# Patient Record
Sex: Female | Born: 1980 | Race: Black or African American | Hispanic: No | Marital: Single | Smoking: Never smoker
Health system: Southern US, Community
[De-identification: ages and names within clinical notes are randomized; demographics above are authoritative.]

## PROBLEM LIST (undated history)

## (undated) DIAGNOSIS — G43909 Migraine, unspecified, not intractable, without status migrainosus: Secondary | ICD-10-CM

## (undated) HISTORY — PX: CHOLECYSTECTOMY: SHX55

## (undated) HISTORY — DX: Migraine, unspecified, not intractable, without status migrainosus: G43.909

---

## 2004-10-14 ENCOUNTER — Emergency Department: Payer: Self-pay | Admitting: Emergency Medicine

## 2005-10-01 ENCOUNTER — Emergency Department: Payer: Self-pay | Admitting: General Practice

## 2005-12-19 ENCOUNTER — Emergency Department: Payer: Self-pay | Admitting: Emergency Medicine

## 2009-07-10 ENCOUNTER — Ambulatory Visit: Payer: Self-pay | Admitting: Family Medicine

## 2010-01-15 ENCOUNTER — Emergency Department: Payer: Self-pay | Admitting: Emergency Medicine

## 2010-08-21 ENCOUNTER — Ambulatory Visit: Payer: Self-pay | Admitting: Internal Medicine

## 2011-11-18 ENCOUNTER — Observation Stay: Payer: Self-pay

## 2011-11-27 ENCOUNTER — Ambulatory Visit: Payer: Self-pay

## 2011-11-27 LAB — CBC WITH DIFFERENTIAL/PLATELET
Basophil #: 0 10*3/uL (ref 0.0–0.1)
Basophil %: 0.3 %
Eosinophil %: 0.5 %
HCT: 31.7 % — ABNORMAL LOW (ref 35.0–47.0)
Lymphocyte #: 1.9 10*3/uL (ref 1.0–3.6)
MCH: 27.4 pg (ref 26.0–34.0)
MCV: 83 fL (ref 80–100)
Monocyte #: 0.9 x10 3/mm (ref 0.2–0.9)
Neutrophil #: 7.1 10*3/uL — ABNORMAL HIGH (ref 1.4–6.5)
Neutrophil %: 71.7 %
RBC: 3.83 10*6/uL (ref 3.80–5.20)
RDW: 15.9 % — ABNORMAL HIGH (ref 11.5–14.5)
WBC: 10 10*3/uL (ref 3.6–11.0)

## 2011-11-28 ENCOUNTER — Inpatient Hospital Stay: Payer: Self-pay

## 2011-11-29 LAB — HEMATOCRIT: HCT: 25.2 % — ABNORMAL LOW (ref 35.0–47.0)

## 2014-02-09 ENCOUNTER — Emergency Department: Payer: Self-pay | Admitting: Emergency Medicine

## 2014-02-09 LAB — COMPREHENSIVE METABOLIC PANEL
ALBUMIN: 4.2 g/dL (ref 3.4–5.0)
ALT: 28 U/L
ANION GAP: 10 (ref 7–16)
AST: 20 U/L (ref 15–37)
Alkaline Phosphatase: 52 U/L
BUN: 14 mg/dL (ref 7–18)
Bilirubin,Total: 0.8 mg/dL (ref 0.2–1.0)
CALCIUM: 9 mg/dL (ref 8.5–10.1)
CO2: 22 mmol/L (ref 21–32)
CREATININE: 0.83 mg/dL (ref 0.60–1.30)
Chloride: 107 mmol/L (ref 98–107)
EGFR (African American): >60
EGFR (Non-African Amer.): >60
Glucose: 115 mg/dL — ABNORMAL HIGH (ref 65–99)
OSMOLALITY: 279 (ref 275–301)
Potassium: 3.5 mmol/L (ref 3.5–5.1)
SODIUM: 139 mmol/L (ref 136–145)
TOTAL PROTEIN: 8.4 g/dL — AB (ref 6.4–8.2)

## 2014-02-09 LAB — URINALYSIS, COMPLETE
Bilirubin,UR: NEGATIVE
GLUCOSE, UR: NEGATIVE mg/dL (ref 0–75)
LEUKOCYTE ESTERASE: NEGATIVE
NITRITE: NEGATIVE
PH: 5 (ref 4.5–8.0)
RBC,UR: 35 /HPF (ref 0–5)
Specific Gravity: 1.033 (ref 1.003–1.030)
Squamous Epithelial: 8
WBC UR: 2 /HPF (ref 0–5)

## 2014-02-09 LAB — CBC WITH DIFFERENTIAL/PLATELET
BASOS ABS: 0.1 10*3/uL (ref 0.0–0.1)
Basophil %: 0.4 %
Eosinophil #: 0 10*3/uL (ref 0.0–0.7)
Eosinophil %: 0.1 %
HCT: 42.6 % (ref 35.0–47.0)
HGB: 13.8 g/dL (ref 12.0–16.0)
LYMPHS ABS: 2.4 10*3/uL (ref 1.0–3.6)
LYMPHS PCT: 15.8 %
MCH: 29.3 pg (ref 26.0–34.0)
MCHC: 32.3 g/dL (ref 32.0–36.0)
MCV: 91 fL (ref 80–100)
MONOS PCT: 7 %
Monocyte #: 1 x10 3/mm — ABNORMAL HIGH (ref 0.2–0.9)
NEUTROS PCT: 76.7 %
Neutrophil #: 11.5 10*3/uL — ABNORMAL HIGH (ref 1.4–6.5)
PLATELETS: 307 10*3/uL (ref 150–440)
RBC: 4.7 10*6/uL (ref 3.80–5.20)
RDW: 13.6 % (ref 11.5–14.5)
WBC: 15 10*3/uL — ABNORMAL HIGH (ref 3.6–11.0)

## 2014-02-09 LAB — LIPASE, BLOOD: Lipase: 76 U/L (ref 73–393)

## 2014-02-09 LAB — PREGNANCY, URINE: PREGNANCY TEST, URINE: NEGATIVE m[IU]/mL

## 2014-03-18 HISTORY — PX: HYSTEROSCOPY: SHX211

## 2014-07-05 NOTE — Op Note (Signed)
PATIENT NAME:  Angela Holmes, Angela Holmes MR#:  [id number] DATE OF BIRTH:  1980/10/26  DATE OF PROCEDURE:  11/28/2011  PREOPERATIVE DIAGNOSIS: Intrauterine pregnancy, term. Previous cesarean section, desires repeat.  POSTOPERATIVE DIAGNOSIS: Intrauterine pregnancy, term. Previous cesarean section, desires repeat.   PROCEDURE PERFORMED: Low transverse cesarean section.   SURGEON: Deloris Pinghilip J. Luella Cookosenow, M.D.   FIRST ASSISTANT: Thomasene MohairStephen Jackson, M.D.  NEONATOLOGIST: Dr. Beckie Saltsasnadi.   OPERATIVE FINDINGS: 8 pounds, 10 ounces female infant, [place], delivered at 8:03 a.m., Apgars 8 and 9. There were some adhesions on the left side.   DESCRIPTION OF PROCEDURE: After adequate conduction anesthesia, the patient was prepped and draped in routine fashion. A skin incision in modified Pfannenstiel fashion was made and carried down the various layers and the peritoneal cavity was entered. Bladder flap was created and the bladder was pushed down. A low transverse incision was made. The above-described infant was delivered without difficulty. The placenta was removed manually. The uterus was then closed with continuous lock suture of chromic one. Several additional sutures were required for hemostasis. Ultimately, hemostasis was obtained. There were some adhesions from the left fundus to the omentum. These were clamped, divided and suture ligated. The pelvis was lavaged with copious amounts of saline. Rectus muscles were reapproximated in the midline. On-Q pump was placed in the fascia, reapproximated with continuous suture of Maxon. Care was taken not to incorporate the On=Q catheters ____________ Deloris PingPhilip J. Luella Cookosenow, MD pjr:ap D: 11/28/2011 08:37:10 ET                T: 11/28/2011 10:40:48 ET              JOB#: [id number] cc: Deloris PingPhilip J. Luella Cookosenow, MD, <Dictator> Towana BadgerPHILIP J Vannary Greening MD ELECTRONICALLY SIGNED 12/02/2011 22:08

## 2014-07-05 NOTE — Op Note (Signed)
PATIENT NAME:  Angela Holmes, Angela Holmes MR#:  [id number] DATE OF BIRTH:  09-10-1980  DATE OF PROCEDURE:  11/28/2011  CONTINUATION OF DICTATION   Care was taken to make sure the On-Q pump was imterior to the fascia. Skin was closed with skin staples. Estimated blood loss was 500 mL. Patient tolerated procedure well, left the Operating Room in good condition. Sponge and needle counts were said to be correct at the end of the procedure.   ____________________________ Deloris PingPhilip J. Luella Cookosenow, MD pjr:cms D: 11/28/2011 08:38:00 ET T: 11/28/2011 10:37:26 ET JOB#: [id number]  cc: Deloris PingPhilip J. Luella Cookosenow, MD, <Dictator>  Towana BadgerPHILIP J Jayren Cease MD ELECTRONICALLY SIGNED 11/29/2011 15:52

## 2015-08-23 LAB — HM PAP SMEAR: HM Pap smear: NEGATIVE

## 2016-06-04 IMAGING — CT CT ABD-PELV W/ CM
2 of 4 series · 16 of 46 positions shown, 18 images · IV contrast (isovue)
Comparison: Right upper quadrant ultrasound earlier this same day.

CLINICAL DATA: Abdominal pain for 3 days. Nausea, vomiting, fever
and chills.

EXAM:
CT ABDOMEN AND PELVIS WITH CONTRAST
TECHNIQUE: Multidetector CT imaging of the abdomen and pelvis was performed
using the standard protocol following bolus administration of
intravenous contrast.
CONTRAST:  100 cc Isovue 370.

[Series 2: routine abd pel with · axial · 0.71mm/px · z∈[-498,-114]mm · 13 of 85 slices shown, 15 images]
[im 4/85  soft-tissue]
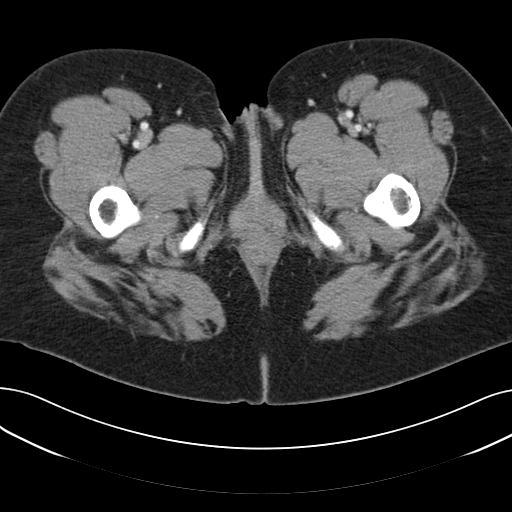
[im 4/85  bone]
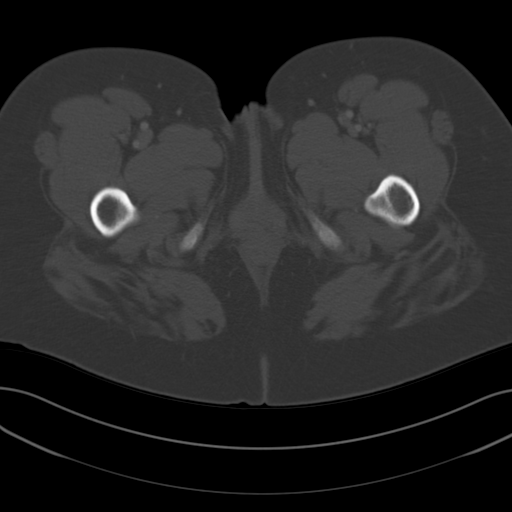
[im 11/85  soft-tissue]
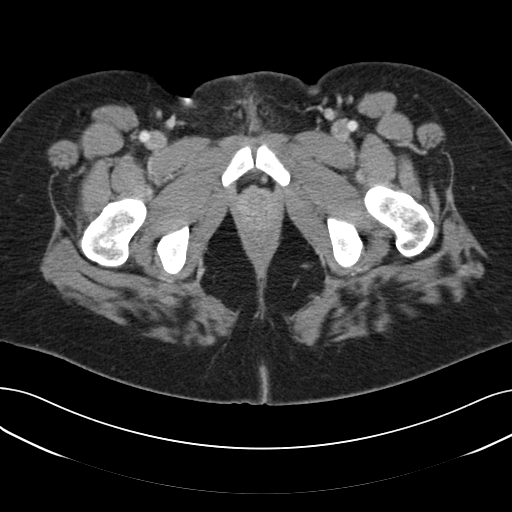
[im 18/85  soft-tissue]
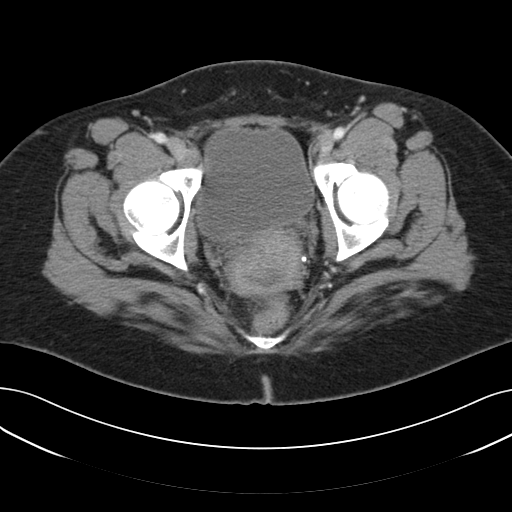
[im 25/85  soft-tissue]
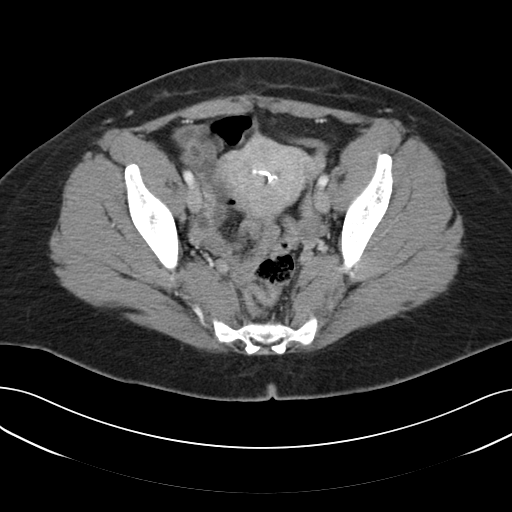
[im 29/85  soft-tissue]
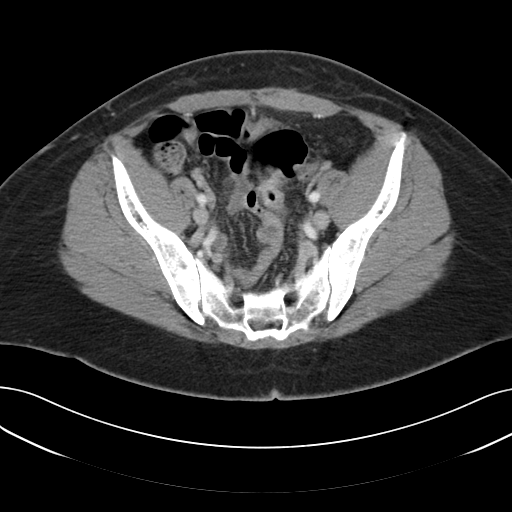
[im 36/85  soft-tissue]
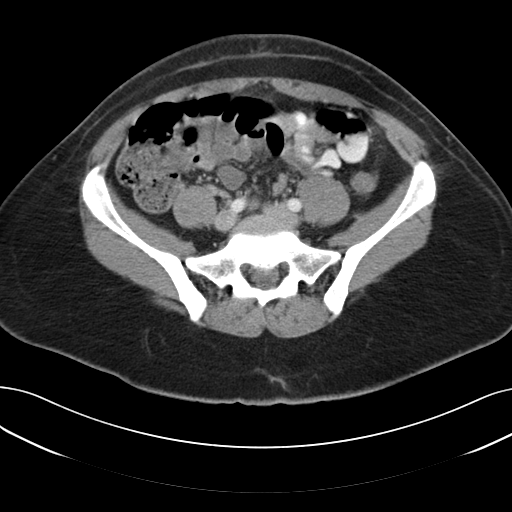
[im 43/85  soft-tissue]
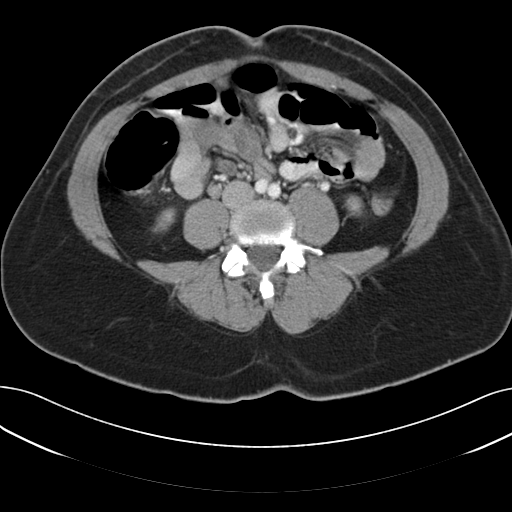
[im 50/85  soft-tissue]
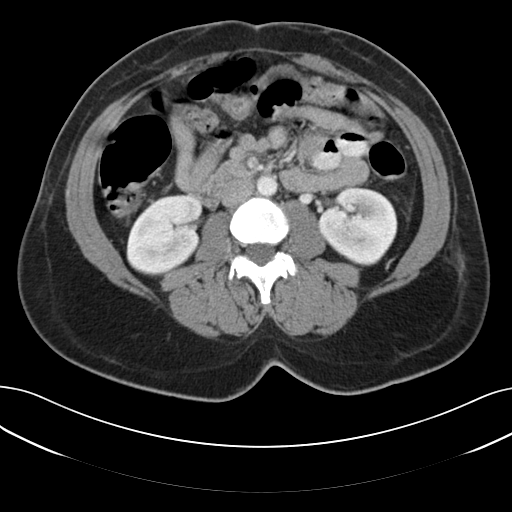
[im 57/85  soft-tissue]
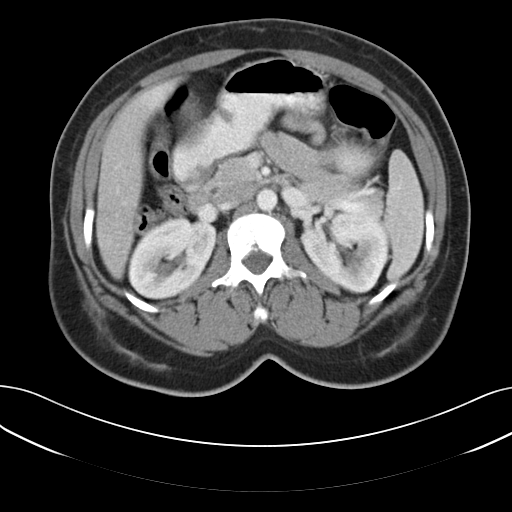
[im 57/85  bone]
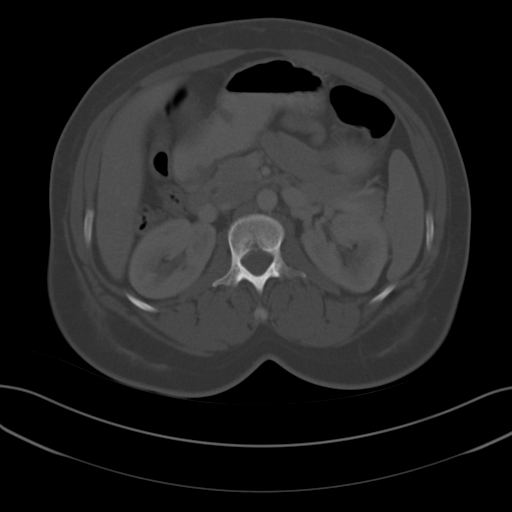
[im 60/85  soft-tissue]
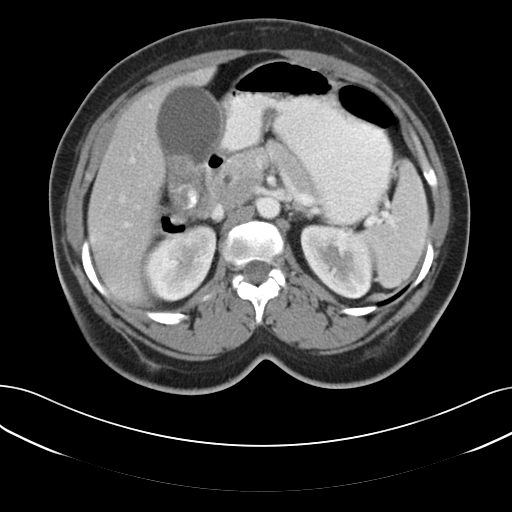
[im 67/85  soft-tissue]
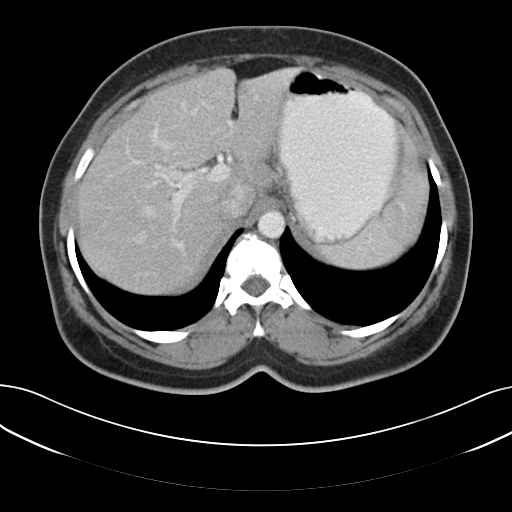
[im 74/85  soft-tissue]
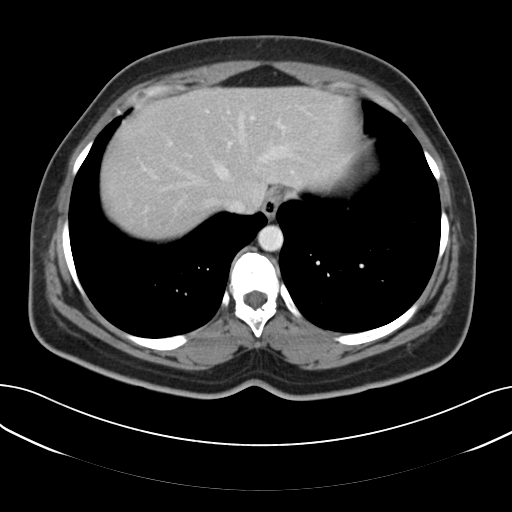
[im 81/85  soft-tissue]
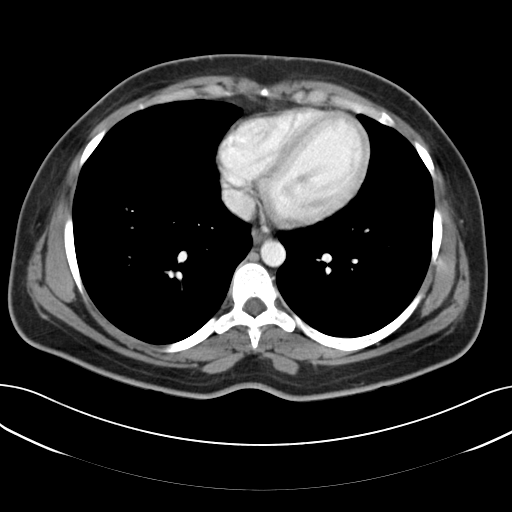

[Series 5: cor routine abd pel with · coronal · 0.77mm/px · 3 of 131 slices shown]
[im 44/131  soft-tissue]
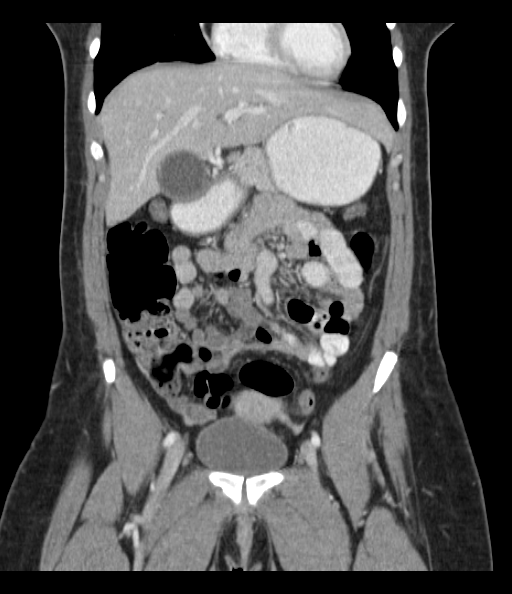
[im 58/131  soft-tissue]
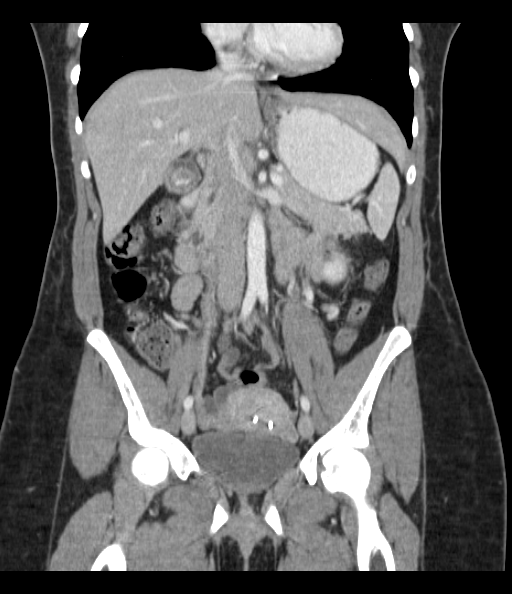
[im 73/131  soft-tissue]
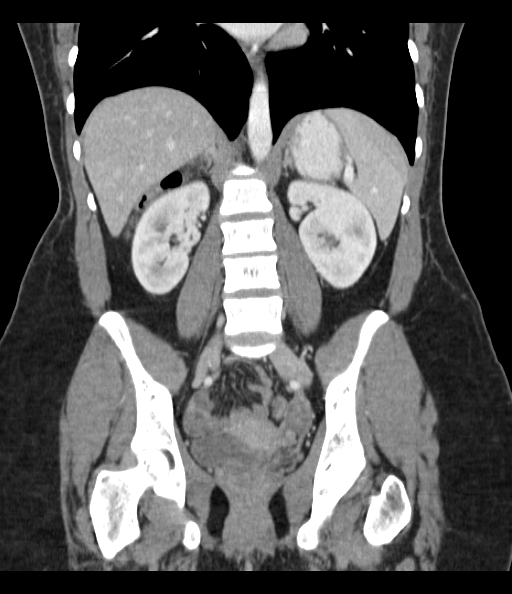

[16 of 46 positions shown; findings below may reference images not displayed]

FINDINGS: The lung bases are clear. There is no pleural or pericardial
effusion.

Three gallstones are identified measuring up to 2.4 cm in diameter.
The gallbladder is mildly dilated but there is no gallbladder wall
thickening or pericholecystic fluid. The liver, biliary tree,
adrenal glands, spleen and pancreas appear normal. The patient has a
duplicated left renal collecting system and there may be duplication
of the right renal collecting system. The kidneys are otherwise
unremarkable.

IUD is in place in the uterus. The uterus is otherwise unremarkable.
Adnexa and urinary bladder appear normal. Trace amount of free
pelvic fluid is compatible with physiologic change. The stomach,
small and large bowel and appendix appear normal. There is no
lymphadenopathy. No lytic or sclerotic bony lesion is seen.
Scattered degenerative disc disease is noted.
IMPRESSION: Negative for appendicitis.  No acute finding abdomen or pelvis.

Gallstones.

Duplicated duplicated left and possibly duplicated right renal
collecting systems.

## 2018-11-17 ENCOUNTER — Encounter: Payer: Self-pay | Admitting: Advanced Practice Midwife

## 2018-11-17 ENCOUNTER — Other Ambulatory Visit: Payer: Self-pay

## 2018-11-17 ENCOUNTER — Ambulatory Visit: Payer: Self-pay

## 2018-11-17 ENCOUNTER — Ambulatory Visit (LOCAL_COMMUNITY_HEALTH_CENTER): Payer: Self-pay | Admitting: Advanced Practice Midwife

## 2018-11-17 VITALS — BP 124/82 | Ht 62.5 in | Wt 189.0 lb

## 2018-11-17 DIAGNOSIS — Z30011 Encounter for initial prescription of contraceptive pills: Secondary | ICD-10-CM

## 2018-11-17 DIAGNOSIS — Z3009 Encounter for other general counseling and advice on contraception: Secondary | ICD-10-CM

## 2018-11-17 MED ORDER — NORGESTIM-ETH ESTRAD TRIPHASIC 0.18/0.215/0.25 MG-25 MCG PO TABS: 1.0000 | ORAL_TABLET | Freq: Every day | ORAL | 0 refills | Status: DC

## 2018-11-17 NOTE — Addendum Note (Signed)
Addended by: Debera Lat on: 11/17/2018 09:44 AM   Modules accepted: Orders

## 2018-11-17 NOTE — Progress Notes (Signed)
In for new [facility] since Depo due; wants to change to [facility] Debera Lat, RN

## 2018-11-17 NOTE — Progress Notes (Signed)
   [place] problem visit  [place] Department  Subjective:  TURA ROLLER is a 38 y.o. G3P2 nonsmoker being seen today for initiation of ocp's  Chief Complaint  Patient presents with  . Contraception    HPI   Does the patient have a current or past history of drug use? No   No components found for: HCV]   Health Maintenance Due  Topic Date Due  . HIV Screening  07/27/1993  . TETANUS/TDAP  07/27/1997  . PAP SMEAR-Modifier  07/27/2008  . INFLUENZA VACCINE  10/17/2018    ROS  The following portions of the patient's history were reviewed and updated as appropriate: allergies, current medications, past family history, past medical history, past social history, past surgical history and problem list. Problem list updated.   See flowsheet for other program required questions.  Objective:   Vitals:   11/17/18 0847  BP: 124/82  Weight: 189 lb (85.7 kg)  Height: 5' 2.5" (1.588 m)    Physical Exam  Not done    Assessment and Plan:  HENLI HEY is a 38 y.o. female presenting to the [facility] Department for a Women's Health problem visit  1. Family planning Pt states has been on DMPA and is due for next DMPA next week but began cramping.  Now wants ocp's instead.  Last pap at [facility] 08/2015 and next due 2022.  Needs PE 02/2019.  LMP 08/2018.  Last sex 2 years ago per pt  2. Encounter for initial prescription of contraceptive pills Medical hx neg for cancer, MI, stoke, embolism, no contraindications for ocp's.  Denies smoking cigs, cigars, MJ May have OTC-Lo #6 I po daily to begin today.  Please counsel on need for backup condoms first week.  Warned about possible spotting/irregular bleeding with initiation of ocp's      Return in about 4 months (around 03/19/2019) for yearly physical exam + ocp's.  No future appointments.  Herbie Saxon, CNM

## 2019-07-21 ENCOUNTER — Encounter: Payer: Self-pay | Admitting: Family Medicine

## 2019-07-21 ENCOUNTER — Ambulatory Visit (LOCAL_COMMUNITY_HEALTH_CENTER): Payer: BLUE CROSS/BLUE SHIELD | Admitting: Family Medicine

## 2019-07-21 ENCOUNTER — Other Ambulatory Visit: Payer: Self-pay

## 2019-07-21 VITALS — BP 119/72 | Ht 62.0 in | Wt 179.2 lb

## 2019-07-21 DIAGNOSIS — Z30013 Encounter for initial prescription of injectable contraceptive: Secondary | ICD-10-CM

## 2019-07-21 DIAGNOSIS — Z3009 Encounter for other general counseling and advice on contraception: Secondary | ICD-10-CM | POA: Diagnosis not present

## 2019-07-21 MED ORDER — MEDROXYPROGESTERONE ACETATE 150 MG/ML IM SUSP
150.0000 mg | INTRAMUSCULAR | Status: AC
  Administered 2019-07-21: 150 mg via INTRAMUSCULAR

## 2019-07-21 NOTE — Progress Notes (Signed)
Patient here to switch from OC to Depo. Last PE 02/2018. Last Pap 2017 at [facility], next due 2022. States she took about 3 weeks of OC and had bad headaches and other symptoms.Burt Knack, RN

## 2019-07-21 NOTE — Progress Notes (Signed)
Family Planning Visit  Subjective:  Angela Holmes is a 39 y.o. being seen today for  Chief Complaint  Patient presents with  . Contraception    wants to switch back to Depo from OC    Pt does not have a problem list on file.  HPI  Patient reports she is here for restart Depo. She switched from depo to OCPs last year, didn't like, would like to switch back. Prior to switch she had been on depo x2 yrs.  Pt denies all of the following, which are contraindications to Depo use: Known breast cancer Pregnancy Also denies: Hypertension (CDC cat 2 if mild, cat 3 if severe) Severe cirrhosis, hepatocellular adenoma Diabetes with nephrosis or vascular complications Ischemic heart disease or multiple risk factors for atherosclerotic disease, and some forms of lupus Unexplained vaginal bleeding Pregnancy planned within the next year Long-term use of corticosteroid therapy in women with a history of, or risk factors for, nontraumatic (frailty) fractures.  Current use of aminoglutethimide (usually for the treatment of Cushing's syndrome) because aminoglutethimide may increase metabolism of progestins   Patient's last menstrual period was 06/25/2019 (exact date). Last sex: 3 yrs ago BCM: none Pt desires EC? n/a  Last pap: 08/2015, normal per pt. Next due 2022.  Last breast exam: last year  Patient reports 0 partner(s) in last year. Do they desire STI screening (if no, why not)? no  Does the patient desire a pregnancy in the next year? no   38 y.o., Body mass index is 32.78 kg/m. - Is patient eligible for HA1C diabetes screening based on BMI and age >69?  no  Has patient been screened once for HCV in the past?  no  No results found for: HCVAB  Does the patient have current of drug use, have a partner with drug use, and/or has been incarcerated since last result? no If yes-- Screen for HCV through [facility]   Does the patient meet criteria for HBV testing? no  Criteria:   -Household, sexual or needle sharing contact with HBV -History of drug use -HIV positive -Those with known Hep C  See flowsheet for other program required questions.   Health Maintenance Due  Topic Date Due  . HIV Screening  Never done  . COVID-19 Vaccine (1) Never done  . TETANUS/TDAP  Never done    ROS  See HPI  The following portions of the patient's history were reviewed and updated as appropriate: allergies, current medications, past family history, past medical history, past social history, past surgical history and problem list. Problem list updated.  Objective:   Vitals:   07/21/19 1324  BP: 119/72  Weight: 179 lb 3.2 oz (81.3 kg)  Height: 5\' 2"  (1.575 m)     Physical Exam  Gen: well appearing, NAD HEENT: no scleral icterus Lung: Normal WOB Ext: well perfused, no edema    Assessment and Plan:  Angela Holmes is a 39 y.o. female presenting to the [facility] Department for a well woman exam/family planning visit  Contraception counseling: Reviewed all forms of birth control options in the tiered based approach. available including abstinence; over the counter/barrier methods; hormonal contraceptive medication including pill, patch, ring, injection,contraceptive implant, ECP; hormonal and nonhormonal IUDs; permanent sterilization options including vasectomy and the various tubal sterilization modalities. Risks, benefits, and typical effectiveness rates were reviewed.  Questions were answered.  Written information was also given to the patient to review.  Patient desires depo, this was prescribed for  patient. She will follow up in  3 month for surveillance.  She was told to call with any further questions, or with any concerns about this method of contraception.  Emphasized use of condoms 100% of the time for STI prevention.  Emergency Contraception: n/a  1. Family planning services -Rx depo x1 year. Counseling as above, including possible risks of  Depo use >2 yrs to BMD. Alternative BCM discussed, pt would like to continue Depo. Suggested calcium (1200 mg daily) and vitamin D (800 IU daily) supplements, weight bearing exercise and avoiding cigarette smoking and excessive alcohol consumption to promote bone health.  - medroxyPROGESTERone (DEPO-PROVERA) injection 150 mg    Return in about 11 weeks (around 10/06/2019) for Depo, Physical exam.  No future appointments.  Kandee Keen, PA-C

## 2019-07-21 NOTE — Progress Notes (Signed)
Depo consent signed and depo given. Next depo card given.Burt Knack, RN

## 2020-03-31 ENCOUNTER — Ambulatory Visit: Payer: BLUE CROSS/BLUE SHIELD

## 2020-04-04 ENCOUNTER — Other Ambulatory Visit: Payer: Self-pay

## 2020-04-04 ENCOUNTER — Ambulatory Visit (LOCAL_COMMUNITY_HEALTH_CENTER): Payer: BLUE CROSS/BLUE SHIELD | Admitting: Physician Assistant

## 2020-04-04 ENCOUNTER — Encounter: Payer: Self-pay | Admitting: Physician Assistant

## 2020-04-04 VITALS — BP 117/74 | Ht 62.0 in | Wt 162.8 lb

## 2020-04-04 DIAGNOSIS — Z0000 Encounter for general adult medical examination without abnormal findings: Secondary | ICD-10-CM | POA: Diagnosis not present

## 2020-04-04 DIAGNOSIS — Z3009 Encounter for other general counseling and advice on contraception: Secondary | ICD-10-CM

## 2020-04-04 DIAGNOSIS — Z3042 Encounter for surveillance of injectable contraceptive: Secondary | ICD-10-CM | POA: Diagnosis not present

## 2020-04-04 MED ORDER — MEDROXYPROGESTERONE ACETATE 150 MG/ML IM SUSP
150.0000 mg | INTRAMUSCULAR | Status: AC
  Administered 2020-04-04: 150 mg via INTRAMUSCULAR

## 2020-04-04 NOTE — Progress Notes (Signed)
Pt to clinic for physical and restart depo. Pt states only sex in the last 3 years was two times in the last 3 days, always with condom, no problems with condoms breaking.

## 2020-04-04 NOTE — Progress Notes (Signed)
[employer]

## 2020-04-04 NOTE — Progress Notes (Signed)
Pt states she will use a back up method like condoms for 2 weeks or just abstain with depo restart today. Pt accepted latex free condoms. Provider orders completed.

## 2020-04-04 NOTE — Progress Notes (Signed)
Family Planning Visit- Repeat Yearly Visit  Subjective:  Angela Holmes is a 40 y.o. [phone]  being seen today for an well woman visit and to discuss family planning options.    She is currently using Condoms for pregnancy prevention. Patient reports she does not want a pregnancy in the next year. Patient  does not have a problem list on file.  Chief Complaint  Patient presents with  . Contraception    PE and restart depo (in hip)    Patient reports that she is doing well and would like to restart Depo.  Denies problems with Depo previously and states that she thinks the last one was in May of 2021.  Per chart review, CBE is due annually and pap is due 08/2020.  Patient denies any concerns today.    See flowsheet for other program required questions.   Body mass index is 29.78 kg/m. - Patient is eligible for diabetes screening based on BMI and age >47?  not applicable HA1C ordered? not applicable  Patient reports 1 partner in last year. Desires STI screening?  No - patient declines.   Has patient been screened once for HCV in the past?  No  No results found for: HCVAB  Does the patient have current of drug use, have a partner with drug use, and/or has been incarcerated since last result? No  If yes-- Screen for HCV through [facility]   Does the patient meet criteria for HBV testing? No  Criteria:  -Household, sexual or needle sharing contact with HBV -History of drug use -HIV positive -Those with known Hep C   Health Maintenance Due  Topic Date Due  . Hepatitis C Screening  Never done  . HIV Screening  Never done  . TETANUS/TDAP  Never done  . INFLUENZA VACCINE  Never done    Review of Systems  All other systems reviewed and are negative.   The following portions of the patient's history were reviewed and updated as appropriate: allergies, current medications, past family history, past medical history, past social history, past surgical history and problem  list. Problem list updated.  Objective:   Vitals:   04/04/20 1435  BP: 117/74  Weight: 162 lb 12.8 oz (73.8 kg)  Height: 5\' 2"  (1.575 m)    Physical Exam Vitals and nursing note reviewed.  Constitutional:      General: She is not in acute distress.    Appearance: Normal appearance.  HENT:     Head: Normocephalic and atraumatic.     Mouth/Throat:     Mouth: Mucous membranes are moist.     Pharynx: Oropharynx is clear. No oropharyngeal exudate or posterior oropharyngeal erythema.  Eyes:     Conjunctiva/sclera: Conjunctivae normal.  Neck:     Thyroid: No thyroid mass, thyromegaly or thyroid tenderness.  Cardiovascular:     Rate and Rhythm: Normal rate and regular rhythm.  Pulmonary:     Effort: Pulmonary effort is normal.     Breath sounds: Normal breath sounds.  Chest:  Breasts:     Right: Normal. No mass, nipple discharge, skin change, tenderness, axillary adenopathy or supraclavicular adenopathy.     Left: Normal. No mass, nipple discharge, skin change, tenderness, axillary adenopathy or supraclavicular adenopathy.    Abdominal:     Palpations: Abdomen is soft. There is no mass.     Tenderness: There is no abdominal tenderness. There is no guarding or rebound.  Musculoskeletal:     Cervical back: Neck supple.  No tenderness.  Lymphadenopathy:     Cervical: No cervical adenopathy.     Upper Body:     Right upper body: No supraclavicular, axillary or pectoral adenopathy.     Left upper body: No supraclavicular, axillary or pectoral adenopathy.  Skin:    General: Skin is warm and dry.     Findings: No bruising, erythema, lesion or rash.  Neurological:     Mental Status: She is alert and oriented to person, place, and time.  Psychiatric:        Mood and Affect: Mood normal.        Behavior: Behavior normal.        Thought Content: Thought content normal.        Judgment: Judgment normal.       Assessment and Plan:  Angela Holmes is a 40 y.o. female [phone]  presenting to the [facility] Department for an yearly well woman exam/family planning visit  Contraception counseling: Reviewed all forms of birth control options in the tiered based approach. available including abstinence; over the counter/barrier methods; hormonal contraceptive medication including pill, patch, ring, injection,contraceptive implant, ECP; hormonal and nonhormonal IUDs; permanent sterilization options including vasectomy and the various tubal sterilization modalities. Risks, benefits, and typical effectiveness rates were reviewed.  Questions were answered.  Written information was also given to the patient to review.  Patient desires to restart Depo, this was prescribed for patient. She will follow up in  3 months and prn for surveillance.  She was told to call with any further questions, or with any concerns about this method of contraception.  Emphasized use of condoms 100% of the time for STI prevention.  Patient was not a candidate for ECP today.    1. Encounter for counseling regarding contraception Reviewed with patient risks, benefits, and SE of Depo and when to call clinic for irregular bleeding. Enc condoms with all sex for 2 weeks after shot today and always for STD protection.   2. Well woman exam (no gynecological exam) Reviewed with patient healthy habits to maintain general health. Enc MVI 1 po daily. Counseled patient that she can schedule for pap at the Depo visit that is most convenient for her. Enc to establish with/ follow up with PCP for primary care concerns, age appropriate screenings and illness.  3. Surveillance for Depo-Provera contraception OK for Depo 150 mg IM q 11-13 weeks for 1 year. OK to start today. - medroxyPROGESTERone (DEPO-PROVERA) injection 150 mg     Return in about 11 weeks (around 06/20/2020) for Depo.  No future appointments.  Matt Holmes, PA

## 2020-04-06 ENCOUNTER — Telehealth: Payer: Self-pay | Admitting: General Practice

## 2020-04-06 NOTE — Telephone Encounter (Signed)
Pt has had both her pfizer vaccines and just got the booster a few weeks ago.

## 2020-05-03 ENCOUNTER — Ambulatory Visit: Payer: BLUE CROSS/BLUE SHIELD
